# Patient Record
Sex: Female | Born: 2000 | Race: White | Hispanic: No | Marital: Single | Smoking: Never smoker
Health system: Southern US, Community
[De-identification: ages and names within clinical notes are randomized; demographics above are authoritative.]

## PROBLEM LIST (undated history)

## (undated) DIAGNOSIS — L509 Urticaria, unspecified: Secondary | ICD-10-CM

## (undated) HISTORY — DX: Urticaria, unspecified: L50.9

---
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
## 2021-05-18 ENCOUNTER — Ambulatory Visit (INDEPENDENT_AMBULATORY_CARE_PROVIDER_SITE_OTHER): Payer: No Typology Code available for payment source | Admitting: Family Medicine

## 2021-05-18 ENCOUNTER — Encounter: Payer: Self-pay | Admitting: Family Medicine

## 2021-05-18 DIAGNOSIS — J3089 Other allergic rhinitis: Secondary | ICD-10-CM | POA: Diagnosis not present

## 2021-05-18 DIAGNOSIS — J302 Other seasonal allergic rhinitis: Secondary | ICD-10-CM

## 2021-05-18 MED ORDER — AMOXICILLIN-POT CLAVULANATE 875-125 MG PO TABS: 1.0000 | ORAL_TABLET | Freq: Two times a day (BID) | ORAL | 0 refills | Status: DC

## 2021-05-18 MED ORDER — AZELASTINE HCL 0.1 % NA SOLN: 2.0000 | Freq: Two times a day (BID) | NASAL | 12 refills | Status: DC

## 2021-05-18 MED ORDER — AZELASTINE HCL 0.05 % OP SOLN: 1.0000 [drp] | Freq: Two times a day (BID) | OPHTHALMIC | 12 refills | Status: DC

## 2021-05-18 NOTE — Assessment & Plan Note (Signed)
Patient's symptoms likely related to underlying allergies.  Treating with azelastine nasal spray also treating with azelastine eyedrops.  If fails to improve or worsens, can start the Augmentin that was prescribed to cover for sinusitis (Wait and see) ?

## 2021-05-18 NOTE — Progress Notes (Signed)
? ?Subjective:  ?Patient ID: Crystal Hester, female    DOB: 27-Jun-2000  Age: 21 y.o. MRN: [id number] ? ?CC: ?Chief Complaint  ?Patient presents with  ? Allergies  ?  Stuffiness, allergy sx, itchy eyes. Began on Sunday. Using allergy med and decongestant   ? ? ?HPI: ? ?21 year old female presents for evaluation the above. ? ?Patient reports that her symptoms started on Saturday to Sunday.  She reports nasal congestion, itchy/watery eyes.  Has also had some rhinorrhea.  She is most bothered by the congestion.  Patient states that this started after she was outside in the garden.  She believes that this is secondary to allergies or possibly that she is developing a secondary sinus infection.  She is taking Zyrtec.  She is not taking Singulair.  No fever.  No other reported symptoms.  No other complaints or concerns at this time. ? ?Patient Active Problem List  ? Diagnosis Date Noted  ? Seasonal and perennial allergic rhinitis 11/11/2018  ? Chronic urticaria 11/11/2018  ? ? ?Social Hx   ?Social History  ? ?Socioeconomic History  ? Marital status: Single  ?  Spouse name: Not on file  ? Number of children: Not on file  ? Years of education: Not on file  ? Highest education level: Not on file  ?Occupational History  ? Not on file  ?Tobacco Use  ? Smoking status: Never  ? Smokeless tobacco: Never  ?Vaping Use  ? Vaping Use: Never used  ?Substance and Sexual Activity  ? Alcohol use: Never  ?  Alcohol/week: 0.0 standard drinks  ? Drug use: Never  ? Sexual activity: Never  ?  Birth control/protection: None  ?Other Topics Concern  ? Not on file  ?Social History Narrative  ? Not on file  ? ?Social Determinants of Health  ? ?Financial Resource Strain: Low Risk   ? Difficulty of Paying Living Expenses: Not hard at all  ?Food Insecurity: No Food Insecurity  ? Worried About Programme researcher, broadcasting/film/video in the Last Year: Never true  ? Ran Out of Food in the Last Year: Never true  ?Transportation Needs: No Transportation Needs  ? Lack of  Transportation (Medical): No  ? Lack of Transportation (Non-Medical): No  ?Physical Activity: Sufficiently Active  ? Days of Exercise per Week: 7 days  ? Minutes of Exercise per Session: 60 min  ?Stress: No Stress Concern Present  ? Feeling of Stress : Not at all  ?Social Connections: Moderately Isolated  ? Frequency of Communication with Friends and Family: More than three times a week  ? Frequency of Social Gatherings with Friends and Family: Twice a week  ? Attends Religious Services: More than 4 times per year  ? Active Member of Clubs or Organizations: No  ? Attends Banker Meetings: Never  ? Marital Status: Never married  ? ? ?Review of Systems ?Per HPI ? ?Objective:  ?BP 111/67   Pulse 66   Temp 97.7 ?F (36.5 ?C)   Wt 161 lb 3.2 oz (73.1 kg)   SpO2 96%   BMI 27.67 kg/m?  ? ? ?  05/18/2021  ? 11:25 AM 09/30/2020  ?  3:11 PM 06/06/2020  ?  8:42 AM  ?BP/Weight  ?Systolic BP 111 122 113  ?Diastolic BP 67 80 72  ?Wt. (Lbs) 161.2 169.5   ?BMI 27.67 kg/m2 29.09 kg/m2   ? ? ?Physical Exam ?Vitals and nursing note reviewed.  ?Constitutional:   ?   General: She is  not in acute distress. ?   Appearance: Normal appearance. She is not ill-appearing.  ?HENT:  ?   Head: Normocephalic and atraumatic.  ?   Right Ear: Tympanic membrane normal.  ?   Left Ear: Tympanic membrane normal.  ?   Nose: Congestion present.  ?Eyes:  ?   General:     ?   Right eye: No discharge.     ?   Left eye: No discharge.  ?   Conjunctiva/sclera: Conjunctivae normal.  ?Cardiovascular:  ?   Rate and Rhythm: Normal rate and regular rhythm.  ?Pulmonary:  ?   Effort: Pulmonary effort is normal.  ?   Breath sounds: Normal breath sounds. No wheezing, rhonchi or rales.  ?Neurological:  ?   Mental Status: She is alert.  ?Psychiatric:     ?   Mood and Affect: Mood normal.     ?   Behavior: Behavior normal.  ? ? ?Lab Results  ?Component Value Date  ? WBC 7.3 11/08/2018  ? HGB 13.8 11/08/2018  ? HCT 41.2 11/08/2018  ? PLT 234 11/08/2018  ?  GLUCOSE 76 11/08/2018  ? ALT 12 11/08/2018  ? AST 17 11/08/2018  ? NA 140 11/08/2018  ? K 4.2 11/08/2018  ? CL 101 11/08/2018  ? CREATININE 0.70 11/08/2018  ? BUN 12 11/08/2018  ? CO2 23 11/08/2018  ? ? ? ?Assessment & Plan:  ? ?Problem List Items Addressed This Visit   ? ?  ? Respiratory  ? Seasonal and perennial allergic rhinitis  ?  Patient's symptoms likely related to underlying allergies.  Treating with azelastine nasal spray also treating with azelastine eyedrops.  If fails to improve or worsens, can start the Augmentin that was prescribed to cover for sinusitis (Wait and see) ? ?  ?  ? ? ?Meds ordered this encounter  ?Medications  ? azelastine (ASTELIN) 0.1 % nasal spray  ?  Sig: Place 2 sprays into both nostrils 2 (two) times daily. Use in each nostril as directed  ?  Dispense:  30 mL  ?  Refill:  12  ? azelastine (OPTIVAR) 0.05 % ophthalmic solution  ?  Sig: Place 1 drop into both eyes 2 (two) times daily.  ?  Dispense:  6 mL  ?  Refill:  12  ? amoxicillin-clavulanate (AUGMENTIN) 875-125 MG tablet  ?  Sig: Take 1 tablet by mouth 2 (two) times daily.  ?  Dispense:  14 tablet  ?  Refill:  0  ? ?Everlene Other DO ?[facility] ? ?

## 2021-05-18 NOTE — Patient Instructions (Signed)
Continue Zyrtec daily. ? ?Medications as prescribed. ? ? ?If you fail to improve or worsen, you can start the antibiotic. ?

## 2021-08-16 ENCOUNTER — Encounter: Payer: Self-pay | Admitting: Adult Health

## 2021-08-16 ENCOUNTER — Ambulatory Visit (INDEPENDENT_AMBULATORY_CARE_PROVIDER_SITE_OTHER): Payer: No Typology Code available for payment source | Admitting: Adult Health

## 2021-08-16 VITALS — BP 114/72 | HR 90 | Ht 64.0 in | Wt 162.0 lb

## 2021-08-16 DIAGNOSIS — Z30011 Encounter for initial prescription of contraceptive pills: Secondary | ICD-10-CM | POA: Diagnosis not present

## 2021-08-16 MED ORDER — LO LOESTRIN FE 1 MG-10 MCG / 10 MCG PO TABS: 1.0000 | ORAL_TABLET | Freq: Every day | ORAL | 3 refills | Status: DC

## 2021-08-16 NOTE — Progress Notes (Signed)
  Subjective:     Patient ID: Halford Decamp, female   DOB: Feb 02, 2000, 21 y.o.   MRN: [id number]  HPI Arrietty is a 21 year old white female,single, G0P0, in to get on rx for OCs, she did take some last year but on regularly. Has never had sex, boyfriend is in [employer], and she she is at [facility]. PCP is Lilyan Punt MD.   Review of Systems Periods regular  Has never had sex    Reviewed past medical,surgical, social and family history. Reviewed medications and allergies.  Objective:   Physical Exam BP 114/72 (BP Location: Left Arm, Patient Position: Sitting, Cuff Size: Normal)   Pulse 90   Ht 5\' 4"  (1.626 m)   Wt 162 lb (73.5 kg)   LMP 08/01/2021 (Approximate)   BMI 27.81 kg/m     Skin warm and dry.  Lungs: clear to ausculation bilaterally. Cardiovascular: regular rate and rhythm.   Upstream - 08/16/21 0950       Pregnancy Intention Screening   Does the patient want to become pregnant in the next year? No    Does the patient's partner want to become pregnant in the next year? No    Would the patient like to discuss contraceptive options today? No      Contraception Wrap Up   Current Method Abstinence    End Method Oral Contraceptive;Abstinence    Contraception Counseling Provided Yes             Assessment:     1. Encounter for initial prescription of contraceptive pills Gave 2 packs to start with next period Use condoms if has sex Discount card given Meds ordered this encounter  Medications   Norethindrone-Ethinyl Estradiol-Fe Biphas (LO LOESTRIN FE) 1 MG-10 MCG / 10 MCG tablet    Sig: Take 1 tablet by mouth daily. Take 1 daily by mouth    Dispense:  84 tablet    Refill:  3    BIN 08/18/21, PCN CN, GRP [id number]    Order Specific Question:   Supervising Provider    Answer:   [id number] [[id number]]       Plan:     Return 03/15/22 for first pap and physical  No GC/CHL done today has never had sex

## 2022-04-10 ENCOUNTER — Telehealth: Payer: Self-pay

## 2022-04-10 ENCOUNTER — Telehealth: Payer: Self-pay | Admitting: Family Medicine

## 2022-04-10 NOTE — Telephone Encounter (Signed)
Pt is calling wanting a copy of immunization call when ready to pick up  Crystal Hester -[phone]

## 2022-04-10 NOTE — Telephone Encounter (Signed)
Please go ahead and order quantoferren TB test

## 2022-04-10 NOTE — Telephone Encounter (Signed)
Lab orders for TB or quanta fern test

## 2022-04-11 ENCOUNTER — Other Ambulatory Visit: Payer: Self-pay

## 2022-04-11 DIAGNOSIS — Z111 Encounter for screening for respiratory tuberculosis: Secondary | ICD-10-CM

## 2022-04-11 NOTE — Telephone Encounter (Signed)
Patient made aware per lab orders. 

## 2022-04-12 NOTE — Telephone Encounter (Signed)
Patient informed immunization record its ready for pick up.

## 2022-04-21 DIAGNOSIS — Z111 Encounter for screening for respiratory tuberculosis: Secondary | ICD-10-CM | POA: Diagnosis not present

## 2022-04-25 LAB — QUANTIFERON-TB GOLD PLUS
QuantiFERON Mitogen Value: >10 IU/mL
QuantiFERON Nil Value: 0.04 IU/mL
QuantiFERON TB1 Ag Value: 0.02 IU/mL
QuantiFERON TB2 Ag Value: 0.04 IU/mL
QuantiFERON-TB Gold Plus: NEGATIVE

## 2022-06-21 ENCOUNTER — Encounter: Payer: Self-pay | Admitting: Adult Health

## 2022-06-21 ENCOUNTER — Other Ambulatory Visit (HOSPITAL_COMMUNITY)
Admission: RE | Admit: 2022-06-21 | Discharge: 2022-06-21 | Disposition: A | Discharge status: Home / Self Care | Payer: 59 | Source: Ambulatory Visit | Attending: Adult Health | Admitting: Adult Health

## 2022-06-21 ENCOUNTER — Ambulatory Visit: Payer: 59 | Admitting: Adult Health

## 2022-06-21 VITALS — BP 121/77 | HR 79 | Ht 64.0 in | Wt 171.5 lb

## 2022-06-21 DIAGNOSIS — N898 Other specified noninflammatory disorders of vagina: Secondary | ICD-10-CM | POA: Insufficient documentation

## 2022-06-21 DIAGNOSIS — Z3041 Encounter for surveillance of contraceptive pills: Secondary | ICD-10-CM

## 2022-06-21 DIAGNOSIS — Z124 Encounter for screening for malignant neoplasm of cervix: Secondary | ICD-10-CM | POA: Insufficient documentation

## 2022-06-21 DIAGNOSIS — N930 Postcoital and contact bleeding: Secondary | ICD-10-CM | POA: Diagnosis not present

## 2022-06-21 DIAGNOSIS — Z3202 Encounter for pregnancy test, result negative: Secondary | ICD-10-CM | POA: Diagnosis not present

## 2022-06-21 LAB — POCT URINE PREGNANCY: Preg Test, Ur: NEGATIVE

## 2022-06-21 MED ORDER — LO LOESTRIN FE 1 MG-10 MCG / 10 MCG PO TABS: 1.0000 | ORAL_TABLET | Freq: Every day | ORAL | 3 refills | Status: DC

## 2022-06-21 NOTE — Progress Notes (Signed)
Patient ID: Crystal Hester, female   DOB: May 20, 2000, 22 y.o.   MRN: [id number] History of Present Illness: Crystal Hester is a 22 year old white female, single, G0P0, in complaining of bleeding after sex at times and she needs a pap.  PCP is Dr Lilyan Punt   Current Medications, Allergies, Past Medical History, Past Surgical History, Family History and Social History were reviewed in [employer] electronic medical record.     Review of Systems: + bleeding after sex at times(BF is in [place], so not having sex often) Has skin in vaginal area Has acne on chin and forehead    Physical Exam:BP 121/77 (BP Location: Left Arm, Patient Position: Sitting, Cuff Size: Normal)   Pulse 79   Ht 5\' 4"  (1.626 m)   Wt 171 lb 8 oz (77.8 kg)   LMP 06/04/2022   BMI 29.44 kg/m  UPT is negative  General:  Well developed, well nourished, no acute distress Skin:  Warm and dry,mild acne on chin Pelvic:  External genitalia is normal in appearance, no lesions.  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 Urethra has no lesions or masses. The cervix is nulliparous, pap with GC/CHL performed.  Uterus is felt to be normal size, shape, and contour.  No adnexal masses or tenderness noted.Bladder is non tender, no masses felt. Extremities/musculoskeletal:  No swelling or varicosities noted, no clubbing or cyanosis Psych:  No mood changes, alert and cooperative,seems happy Fall risk is low  Upstream - 06/21/22 0943       Pregnancy Intention Screening   Does the patient want to become pregnant in the next year? No    Does the patient's partner want to become pregnant in the next year? No    Would the patient like to discuss contraceptive options today? No      Contraception Wrap Up   Current Method Oral Contraceptive    End Method Oral Contraceptive            Examination chaperoned by Malachy Mood LPN   Impression and Plan: 1. Routine  Papanicolaou smear Pap sent Pap in 3 years if normal Physical in 1 year - Cytology - PAP( [facility])  2. Pregnancy examination or test, negative result - POCT urine pregnancy  3. Postcoital bleeding Use good lubricate Try vaginal dilator since not having sex often   4. Encounter for surveillance of contraceptive pills She likes lo Loestrin, will refill   Meds ordered this encounter  Medications   Norethindrone-Ethinyl Estradiol-Fe Biphas (LO LOESTRIN FE) 1 MG-10 MCG / 10 MCG tablet    Sig: Take 1 tablet by mouth daily. Take 1 daily by mouth    Dispense:  84 tablet    Refill:  3    BIN [id number], PCN CN, GRP [id number]    Order Specific Question:   Supervising Provider    Answer:   Duane Lope H [[id number]]     5. Hymenal remnant Can leave alone or if decides she wants trimmed, call for appt with MD

## 2022-06-22 LAB — CYTOLOGY - PAP
Adequacy: ABSENT
Chlamydia: NEGATIVE
Comment: NEGATIVE
Comment: NORMAL
Diagnosis: NEGATIVE
Neisseria Gonorrhea: NEGATIVE

## 2022-07-17 ENCOUNTER — Telehealth: Payer: Self-pay

## 2022-07-17 NOTE — Telephone Encounter (Signed)
Scopolamine patches, 1 box, apply 2 to 3 hours before going on the trip, may change the patch every 3 days, if the patches cause severe vomiting severe dizziness then discontinue the patch  May send in 1 box

## 2022-07-17 NOTE — Telephone Encounter (Signed)
Pt is going on a cruise and needs nausea patches called into [facility] on [address]

## 2022-07-18 ENCOUNTER — Other Ambulatory Visit: Payer: Self-pay

## 2022-07-18 DIAGNOSIS — R110 Nausea: Secondary | ICD-10-CM

## 2022-07-18 MED ORDER — SCOPOLAMINE 1 MG/3DAYS TD PT72: MEDICATED_PATCH | TRANSDERMAL | 0 refills | Status: DC

## 2022-11-20 ENCOUNTER — Ambulatory Visit: Payer: Self-pay | Admitting: Family Medicine

## 2022-11-20 NOTE — Telephone Encounter (Signed)
Copied from CRM [id number]. Topic: Clinical - Red Word Triage >> Nov 20, 2022  9:52 AM Deaijah H wrote: Red Word that prompted transfer to Nurse Triage: Upper left chest pain    Chief Complaint: Left side chest pain Symptoms: intermittent left side chest pain Frequency: Pain is intermittent Pertinent Negatives: Patient denies chest wall injury, shortness of breath, or radiating pain Disposition: [] ED /[] Urgent Care (no appt availability in office) / [] Appointment(In office/virtual)/ []  [facility]/ [] Home Care/ [] Refused Recommended Disposition /[] [place] [facility]/ [x]  Follow-up with PCP Additional Notes:   Reason for Disposition  [1] Chest pain(s) lasting a few seconds AND [2] persists > 3 days  Answer Assessment - Initial Assessment Questions 1. LOCATION: "Where does it hurt?"       Mainly on the left upper side  2. RADIATION: "Does the pain go anywhere else?" (e.g., into neck, jaw, arms, back)     Pain does that radiate anywhere  3. ONSET: "When did the chest pain begin?" (Minutes, hours or days)      Several months, before the summer  4. PATTERN: "Does the pain come and go, or has it been constant since it started?"  "Does it get worse with exertion?"      Pains comes and goes during the day, about 2x a days. Pain does get worse with certain movements  5. DURATION: "How long does it last" (e.g., seconds, minutes, hours)     Pain last less than a minute  6. SEVERITY: "How bad is the pain?"  (e.g., Scale 1-10; mild, moderate, or severe)    - MILD (1-3): doesn't interfere with normal activities     - MODERATE (4-7): interferes with normal activities or awakens from sleep    - SEVERE (8-10): excruciating pain, unable to do any normal activities       2, its enough to notice the pain, but not stop activity  7. CARDIAC RISK FACTORS: "Do you have any history of heart problems or risk factors for heart disease?" (e.g., angina, prior heart attack; diabetes, high blood  pressure, high cholesterol, smoker, or strong family history of heart disease)     Mom has history of AFD; recently diagnosed  8. PULMONARY RISK FACTORS: "Do you have any history of lung disease?"  (e.g., blood clots in lung, asthma, emphysema, birth control pills)     Take birth control  9. CAUSE: "What do you think is causing the chest pain?"     Could be related to weight lifting and working out  10. OTHER SYMPTOMS: "Do you have any other symptoms?" (e.g., dizziness, nausea, vomiting, sweating, fever, difficulty breathing, cough)       No  11. PREGNANCY: "Is there any chance you are pregnant?" "When was your last menstrual period?"       No; 2 weeks ago was last menstrual.  Protocols used: Chest Pain-A-AH

## 2022-11-20 NOTE — Telephone Encounter (Signed)
FYI:  Patient scheduled appointment: With Family Medicine 11/29/2022 at 9:20 AM

## 2022-11-29 ENCOUNTER — Ambulatory Visit: Payer: 59 | Admitting: Family Medicine

## 2022-11-29 VITALS — BP 110/71 | HR 88 | Temp 97.7°F | Ht 64.0 in | Wt 173.4 lb

## 2022-11-29 DIAGNOSIS — Z8279 Family history of other congenital malformations, deformations and chromosomal abnormalities: Secondary | ICD-10-CM | POA: Diagnosis not present

## 2022-11-29 DIAGNOSIS — R079 Chest pain, unspecified: Secondary | ICD-10-CM

## 2022-11-29 NOTE — Patient Instructions (Signed)
Labs ordered.  EKG looks good.  Echo ordered.  We will call with results.

## 2022-11-30 ENCOUNTER — Encounter: Payer: Self-pay | Admitting: Family Medicine

## 2022-11-30 LAB — CMP14+EGFR
ALT: 16 [IU]/L (ref 0–32)
AST: 18 [IU]/L (ref 0–40)
Albumin: 4.2 g/dL (ref 4.0–5.0)
Alkaline Phosphatase: 60 [IU]/L (ref 44–121)
BUN/Creatinine Ratio: 15 (ref 9–23)
BUN: 12 mg/dL (ref 6–20)
Bilirubin Total: 0.3 mg/dL (ref 0.0–1.2)
CO2: 21 mmol/L (ref 20–29)
Calcium: 9.3 mg/dL (ref 8.7–10.2)
Chloride: 106 mmol/L (ref 96–106)
Creatinine, Ser: 0.78 mg/dL (ref 0.57–1.00)
Globulin, Total: 2.3 g/dL (ref 1.5–4.5)
Glucose: 93 mg/dL (ref 70–99)
Potassium: 4.6 mmol/L (ref 3.5–5.2)
Sodium: 139 mmol/L (ref 134–144)
Total Protein: 6.5 g/dL (ref 6.0–8.5)
eGFR: 111 mL/min/{1.73_m2} (ref >59)

## 2022-11-30 LAB — CBC
Hematocrit: 42.2 % (ref 34.0–46.6)
Hemoglobin: 13.4 g/dL (ref 11.1–15.9)
MCH: 28.5 pg (ref 26.6–33.0)
MCHC: 31.8 g/dL (ref 31.5–35.7)
MCV: 90 fL (ref 79–97)
Platelets: 227 10*3/uL (ref 150–450)
RBC: 4.71 x10E6/uL (ref 3.77–5.28)
RDW: 12.2 % (ref 11.7–15.4)
WBC: 4.5 10*3/uL (ref 3.4–10.8)

## 2022-11-30 LAB — D-DIMER, QUANTITATIVE: D-DIMER: 0.8 mg{FEU}/L — ABNORMAL HIGH (ref 0.00–0.49)

## 2022-12-01 ENCOUNTER — Ambulatory Visit: Payer: Self-pay

## 2022-12-01 ENCOUNTER — Encounter (HOSPITAL_BASED_OUTPATIENT_CLINIC_OR_DEPARTMENT_OTHER): Payer: Self-pay | Admitting: Urology

## 2022-12-01 ENCOUNTER — Telehealth: Payer: Self-pay | Admitting: Family Medicine

## 2022-12-01 ENCOUNTER — Other Ambulatory Visit: Payer: Self-pay | Admitting: Family Medicine

## 2022-12-01 ENCOUNTER — Emergency Department (HOSPITAL_BASED_OUTPATIENT_CLINIC_OR_DEPARTMENT_OTHER)
Admission: EM | Admit: 2022-12-01 | Discharge: 2022-12-01 | Disposition: A | Discharge status: Home / Self Care | Payer: 59 | Attending: Emergency Medicine | Admitting: Emergency Medicine

## 2022-12-01 ENCOUNTER — Other Ambulatory Visit: Payer: Self-pay

## 2022-12-01 ENCOUNTER — Emergency Department (HOSPITAL_BASED_OUTPATIENT_CLINIC_OR_DEPARTMENT_OTHER): Payer: 59

## 2022-12-01 DIAGNOSIS — R7989 Other specified abnormal findings of blood chemistry: Secondary | ICD-10-CM | POA: Diagnosis not present

## 2022-12-01 DIAGNOSIS — R0789 Other chest pain: Secondary | ICD-10-CM | POA: Insufficient documentation

## 2022-12-01 DIAGNOSIS — R079 Chest pain, unspecified: Secondary | ICD-10-CM | POA: Diagnosis not present

## 2022-12-01 MED ORDER — IOHEXOL 350 MG/ML SOLN
75.0000 mL | Freq: Once | INTRAVENOUS | Status: AC | PRN
  Administered 2022-12-01: 75 mL via INTRAVENOUS

## 2022-12-01 NOTE — ED Triage Notes (Signed)
Pt states elevated d-dimer of 0.8 Left sided chest pain intermittent since June  Seen at PCP Friday and told to come today for CTA of Chest

## 2022-12-01 NOTE — Telephone Encounter (Signed)
On a three-way phone call spoke with patient as well as her mother Crystal Hester Date of birth verified with [name] She is on birth control pills She has elevated D-dimer Atypical sharp chest pain has been going on for over a month but because of elevated D-dimer there is no safe alternative on a Friday evening other than to go to the ER to get this evaluated along with CT scan of the chest rule out pulmonary embolism Patient is aware to go ahead with doing this We await the results of all of this additional workup If they should need [place] for anything else mom will reach out or patient will  Copy being forwarded to Dr. Adriana Simas as well

## 2022-12-01 NOTE — Discharge Instructions (Signed)
You were seen in the ER today with an elevated d-dimer test. Your CT angio was negative for signs of any clot in your lungs. There are no signs of pneumonia either or any other lung abnormality. This level was likely elevated for a number of other factors that are benign. I would recommend following up with your primary care provider for further evaluation. Your symptoms could likely be stress related given your current course load in school but I would seek further care if symptoms are worsening. Good luck with senior year and future PT school!

## 2022-12-01 NOTE — ED Provider Notes (Signed)
[facility] Provider Note   CSN: [id number] Arrival date & time: 12/01/22  1922     History Chief Complaint  Patient presents with   Elevated D-Dimer    Crystal Hester is a 22 y.o. female. Patient with no significant past history presents to the ED with concerns of elevated d-dimer. Patient has been experiencing intermittent left sided chest pain and tightness since about June 2024. Had labs checked by PCP which included d-dimer which was unfortunately elevated. Patient is currently on OCP but no other risk factors such as recent surgery, immobilization, prolonged travel, hemoptysis, or unilateral leg swelling. No history of DVT/PE. Patient's mother in room does however report that she herself, mother, had a PE in her 30s with only risk factor being OCP. No history of any familial coagulopathy as far as mother is aware. Patient was advised by PCP to present to the ED today for CTA given elevated d-dimer. Currently asymptomatic.  HPI     Home Medications Prior to Admission medications   Medication Sig Start Date End Date Taking? Authorizing Provider  cetirizine (ZYRTEC) 10 MG tablet Take 1 tablet 1-2 times daily as directed. Patient not taking: Reported on 11/29/2022 11/08/18   Alfonse Spruce, MD  EPINEPHrine (EPIPEN 2-PAK) 0.3 mg/0.3 mL IJ SOAJ injection Inject 0.3 mg into the muscle as needed for anaphylaxis. Please dispense mylan or teva brand Patient not taking: Reported on 11/29/2022 04/06/20   Alfonse Spruce, MD  Norethindrone-Ethinyl Estradiol-Fe Biphas (LO LOESTRIN FE) 1 MG-10 MCG / 10 MCG tablet Take 1 tablet by mouth daily. Take 1 daily by mouth 06/21/22   Adline Potter, NP  scopolamine (TRANSDERM-SCOP) 1 MG/3DAYS apply patch approximately 2 hours before the cruise then change the patch every 3 days, discontinue patch if severe dizziness vomiting headaches 07/18/22   Babs Sciara, MD      Allergies    Bactrim  [sulfamethoxazole-trimethoprim]    Review of Systems   Review of Systems  Respiratory:  Positive for chest tightness. Negative for shortness of breath.   All other systems reviewed and are negative.   Physical Exam Updated Vital Signs BP 122/84   Pulse (!) 59   Temp 97.7 F (36.5 C) (Oral)   Resp (!) 21   Ht 5\' 4"  (1.626 m)   Wt 78.6 kg   LMP 11/14/2022   SpO2 100%   BMI 29.74 kg/m  Physical Exam Vitals and nursing note reviewed.  Constitutional:      General: She is not in acute distress.    Appearance: She is well-developed.  HENT:     Head: Normocephalic and atraumatic.  Eyes:     Conjunctiva/sclera: Conjunctivae normal.  Cardiovascular:     Rate and Rhythm: Normal rate and regular rhythm.     Heart sounds: No murmur heard. Pulmonary:     Effort: Pulmonary effort is normal. No respiratory distress.     Breath sounds: Normal breath sounds.  Abdominal:     Palpations: Abdomen is soft.     Tenderness: There is no abdominal tenderness.  Musculoskeletal:        General: No swelling.     Cervical back: Neck supple.  Skin:    General: Skin is warm and dry.     Capillary Refill: Capillary refill takes less than 2 seconds.  Neurological:     Mental Status: She is alert.  Psychiatric:        Mood and Affect: Mood  normal.     ED Results / Procedures / Treatments   Labs (all labs ordered are listed, but only abnormal results are displayed) Labs Reviewed - No data to display  EKG None  Radiology CT Angio Chest PE W and/or Wo Contrast  Result Date: 12/01/2022 CLINICAL DATA:  Positive D-dimer.  Left-sided chest pain. EXAM: CT ANGIOGRAPHY CHEST WITH CONTRAST TECHNIQUE: Multidetector CT imaging of the chest was performed using the standard protocol during bolus administration of intravenous contrast. Multiplanar CT image reconstructions and MIPs were obtained to evaluate the vascular anatomy. RADIATION DOSE REDUCTION: This exam was performed according to the  departmental dose-optimization program which includes automated exposure control, adjustment of the mA and/or kV according to patient size and/or use of iterative reconstruction technique. CONTRAST:  75mL OMNIPAQUE IOHEXOL 350 MG/ML SOLN COMPARISON:  None Available. FINDINGS: Cardiovascular: Satisfactory opacification of the pulmonary arteries to the segmental level. No evidence of pulmonary embolism. Normal heart size. No pericardial effusion. Mediastinum/Nodes: No enlarged mediastinal, hilar, or axillary lymph nodes. Thyroid gland, trachea, and esophagus demonstrate no significant findings. 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 Lungs/Pleura: Lungs are clear. No pleural effusion or pneumothorax. Upper Abdomen: No acute abnormality. Musculoskeletal: No chest wall abnormality. No acute or significant osseous findings. Review of the MIP images confirms the above findings. IMPRESSION: No evidence for pulmonary embolism or other acute cardiopulmonary process. Electronically Signed   By: Darliss Cheney M.D.   On: 12/01/2022 20:49    Procedures Procedures   Medications Ordered in ED Medications  iohexol (OMNIPAQUE) 350 MG/ML injection 75 mL (75 mLs Intravenous Contrast Given 12/01/22 2021)    ED Course/ Medical Decision Making/ A&P                               Medical Decision Making Amount and/or Complexity of Data Reviewed Radiology: ordered.  Risk Prescription drug management.   This patient presents to the ED for concern of elevated d-dimer. Differential diagnosis includes PE, DVT, bleeding disorder, dehydration, recent immobilization   Lab Tests:  I Ordered, and personally interpreted labs.  The pertinent results include:  Labs from 11/29/2022 with PCP show unremarkable CBC, normal CMP14+EGFR, and elevated d-dimer at 0.8.    Imaging Studies ordered:  I ordered imaging studies including CT angio chest I independently visualized and interpreted imaging which showed no  evidence of PE or any other pulmonary abnormalities I agree with the radiologist interpretation   Problem List / ED Course:  Patient without significant past medical history presents to the emergency department with concerns of an elevated D-dimer.  Patient was evaluated by primary care provider in the last few days and had blood work done.  D-dimer was elevated and was ordered initially out of concerns of patient's prolonged course of intermittent chest tightness since about June of this year.  Patient is on an OCP but no other major risk factors.  Given patient's use of OCP, cannot be perked out and therefore D-dimer was ordered.  Given elevation in D-dimer and prolonged course of symptoms, CT angio will be performed today.  Patient denies any chance of pregnancy and even had a negative pregnancy test earlier today at home.  Currently asymptomatic at this time and no acute focal concerns.  No acute indication for repeat labs or any symptomatic treatment at this time. CT angio performed waiting on radiologist read. CT angio chest negative for signs of pulmonary embolism or any other  acute abnormality.  Suspect symptoms could possibly be stress related as patient is currently a senior in college and applying to graduate programs for her future career plans.  She does not overtly have any signs of panic disorder and is not appear to be any sort of mental distress although discussed with patient and mother about possible stress-induced symptoms. Informed patient of these findings and encourage patient to follow-up with primary care provider for further assessment.  Discussed strict return precautions.  Patient discharged home in stable condition.  Final Clinical Impression(s) / ED Diagnoses Final diagnoses:  Chest tightness    Rx / DC Orders ED Discharge Orders     None         Smitty Knudsen, PA-C 12/01/22 2109    Royanne Foots, DO 12/09/22 1041

## 2022-12-02 DIAGNOSIS — R079 Chest pain, unspecified: Secondary | ICD-10-CM | POA: Insufficient documentation

## 2022-12-02 NOTE — Progress Notes (Addendum)
Subjective:  Patient ID: Crystal Hester, female    DOB: 2000/10/15  Age: 22 y.o. MRN: [id number]  CC:  Chest pain   HPI:  22 year old female presents for evaluation of the above.  Patient has had intermittent sharp, left-sided chest pain since June.  Seems to be becoming more frequent.  Seems to be exacerbated by certain movements.  Patient thinks that anxiety may be playing a role as well.  No palpitations.  No shortness of breath.  She is on oral contraceptives.  Mother is concerned as she had an ASD that was discovered after she was having some chest discomfort and prior TIA.  This occurred when the mother was young.  She is able to exercise without difficulty.  No relieving factors.  Patient Active Problem List   Diagnosis Date Noted   Chest pain 12/02/2022   Encounter for surveillance of contraceptive pills 06/21/2022   Seasonal and perennial allergic rhinitis 11/11/2018   Chronic urticaria 11/11/2018    Social Hx   Social History   Socioeconomic History   Marital status: Single    Spouse name: Not on file   Number of children: Not on file   Years of education: Not on file   Highest education level: Bachelor's degree (e.g., BA, AB, BS)  Occupational History   Not on file  Tobacco Use   Smoking status: Never   Smokeless tobacco: Never  Vaping Use   Vaping status: Never Used  Substance and Sexual Activity   Alcohol use: Never    Alcohol/week: 0.0 standard drinks of alcohol   Drug use: Never   Sexual activity: Yes    Birth control/protection: Pill, Condom  Other Topics Concern   Not on file  Social History Narrative   Not on file   Social Drivers of Health   Financial Resource Strain: Low Risk  (11/28/2022)   Overall Financial Resource Strain (CARDIA)    Difficulty of Paying Living Expenses: Not hard at all  Food Insecurity: No Food Insecurity (11/28/2022)   Hunger Vital Sign    Worried About Running Out of Food in the Last Year: Never true    Ran Out of  Food in the Last Year: Never true  Transportation Needs: No Transportation Needs (11/28/2022)   PRAPARE - Administrator, Civil Service (Medical): No    Lack of Transportation (Non-Medical): No  Physical Activity: Sufficiently Active (11/28/2022)   Exercise Vital Sign    Days of Exercise per Week: 5 days    Minutes of Exercise per Session: 40 min  Stress: Stress Concern Present (11/28/2022)   [employer] of Occupational Health - Occupational Stress Questionnaire    Feeling of Stress : To some extent  Social Connections: Moderately Isolated (11/28/2022)   Social Connection and Isolation Panel [NHANES]    Frequency of Communication with Friends and Family: More than three times a week    Frequency of Social Gatherings with Friends and Family: More than three times a week    Attends Religious Services: More than 4 times per year    Active Member of [employer] or Organizations: No    Attends Engineer, structural: Not on file    Marital Status: Never married    Review of Systems Per HPI  Objective:  BP 110/71   Pulse 88   Temp 97.7 F (36.5 C)   Ht 5\' 4"  (1.626 m)   Wt 173 lb 6.4 oz (78.7 kg)   SpO2 99%   BMI  29.76 kg/m      12/01/2022    9:00 PM 12/01/2022    7:45 PM 12/01/2022    7:29 PM  BP/Weight  Systolic BP 122 143 156  Diastolic BP 84 90 99    Physical Exam Vitals and nursing note reviewed.  Constitutional:      General: She is not in acute distress.    Appearance: Normal appearance.  HENT:     Head: Normocephalic and atraumatic.  Eyes:     General:        Right eye: No discharge.        Left eye: No discharge.     Conjunctiva/sclera: Conjunctivae normal.  Cardiovascular:     Rate and Rhythm: Normal rate and regular rhythm.     Heart sounds: No murmur heard. Pulmonary:     Effort: Pulmonary effort is normal.     Breath sounds: Normal breath sounds. No wheezing, rhonchi or rales.  Neurological:     Mental Status: She is alert.   Psychiatric:        Mood and Affect: Mood normal.        Behavior: Behavior normal.     Lab Results  Component Value Date   WBC 4.5 11/29/2022   HGB 13.4 11/29/2022   HCT 42.2 11/29/2022   PLT 227 11/29/2022   GLUCOSE 93 11/29/2022   ALT 16 11/29/2022   AST 18 11/29/2022   NA 139 11/29/2022   K 4.6 11/29/2022   CL 106 11/29/2022   CREATININE 0.78 11/29/2022   BUN 12 11/29/2022   CO2 21 11/29/2022     Assessment & Plan:   Problem List Items Addressed This Visit       Other   Chest pain - Primary   Labs today including D-dimer.  Arranging echo. EKG was obtained today and was independently reviewed by me.  Interpretation: Normal sinus rhythm with a rate of 67.  Incomplete right bundle-branch block.  No ST or T wave abnormalities.  Overall, her exam is reassuring and she has no significant cardiovascular risk factors.      Relevant Orders   CBC (Completed)   CMP14+EGFR (Completed)   D-dimer, quantitative (Completed)   ECHOCARDIOGRAM COMPLETE BUBBLE STUDY   EKG 12-Lead (Completed)   Other Visit Diagnoses       Family history of ASD (atrial septal defect)       Relevant Orders   ECHOCARDIOGRAM COMPLETE BUBBLE STUDY       Follow-up:  Pending workup   Shyra Emile Adriana Simas DO [facility]

## 2022-12-02 NOTE — Assessment & Plan Note (Addendum)
Labs today including D-dimer.  Arranging echo. EKG was obtained today and was independently reviewed by me.  Interpretation: Normal sinus rhythm with a rate of 67.  Incomplete right bundle-branch block.  No ST or T wave abnormalities.  Overall, her exam is reassuring and she has no significant cardiovascular risk factors.

## 2022-12-04 NOTE — Telephone Encounter (Signed)
Crystal Sams, DO     Dr. Lorin Picket directed her the ED. She had CTA.

## 2022-12-14 ENCOUNTER — Encounter: Payer: Self-pay | Admitting: Family Medicine

## 2022-12-14 ENCOUNTER — Other Ambulatory Visit: Payer: Self-pay | Admitting: Family Medicine

## 2022-12-17 ENCOUNTER — Other Ambulatory Visit: Payer: Self-pay | Admitting: Family Medicine

## 2022-12-17 DIAGNOSIS — R079 Chest pain, unspecified: Secondary | ICD-10-CM

## 2022-12-18 NOTE — Telephone Encounter (Signed)
Tommie Sams, DO t    Order has been placed in an orders only encounter.

## 2022-12-18 NOTE — Telephone Encounter (Signed)
Crystal Sams, DO   Order has been placed in an orders only encounter.

## 2022-12-21 ENCOUNTER — Other Ambulatory Visit: Payer: Self-pay | Admitting: Family Medicine

## 2022-12-21 ENCOUNTER — Ambulatory Visit (HOSPITAL_COMMUNITY): Payer: 59 | Attending: Cardiology

## 2022-12-21 DIAGNOSIS — Q211 Atrial septal defect, unspecified: Secondary | ICD-10-CM

## 2022-12-21 DIAGNOSIS — R079 Chest pain, unspecified: Secondary | ICD-10-CM | POA: Diagnosis not present

## 2022-12-21 LAB — ECHOCARDIOGRAM COMPLETE BUBBLE STUDY
Area-P 1/2: 3.5 cm2
S' Lateral: 3.4 cm

## 2022-12-26 ENCOUNTER — Inpatient Hospital Stay: Payer: 59 | Admitting: Family Medicine

## 2023-01-12 ENCOUNTER — Ambulatory Visit: Payer: 59 | Admitting: Cardiovascular Disease

## 2023-01-25 ENCOUNTER — Encounter: Payer: Self-pay | Admitting: Cardiovascular Disease

## 2023-01-25 ENCOUNTER — Ambulatory Visit: Payer: 59 | Attending: Cardiovascular Disease | Admitting: Cardiovascular Disease

## 2023-01-25 VITALS — BP 104/66 | HR 77 | Ht 64.0 in | Wt 167.8 lb

## 2023-01-25 DIAGNOSIS — R079 Chest pain, unspecified: Secondary | ICD-10-CM

## 2023-01-25 NOTE — Patient Instructions (Signed)
Testing/Procedures: ECHO prior to 1 year follow up Your physician has requested that you have an echocardiogram. Echocardiography is a painless test that uses sound waves to create images of your heart. It provides your doctor with information about the size and shape of your heart and how well your heart's chambers and valves are working. This procedure takes approximately one hour. There are no restrictions for this procedure. Please do NOT wear cologne, perfume, aftershave, or lotions (deodorant is allowed). Please arrive 15 minutes prior to your appointment time.  Please note: We ask at that you not bring children with you during ultrasound (echo/ vascular) testing. Due to room size and safety concerns, children are not allowed in the ultrasound rooms during exams. Our front office staff cannot provide observation of children in our lobby area while testing is being conducted. An adult accompanying a patient to their appointment will only be allowed in the ultrasound room at the discretion of the ultrasound technician under special circumstances. We apologize for any inconvenience.   Follow-Up: At [facility], you and your health needs are our priority.  As part of our continuing mission to provide you with exceptional heart care, we have created designated Provider Care Teams.  These Care Teams include your primary Cardiologist (physician) and Advanced Practice Providers (APPs -  Physician Assistants and Nurse Practitioners) who all work together to provide you with the care you need, when you need it.  We recommend signing up for the patient portal called "MyChart".  Sign up information is provided on this After Visit Summary.  MyChart is used to connect with patients for Virtual Visits (Telemedicine).  Patients are able to view lab/test results, encounter notes, upcoming appointments, etc.  Non-urgent messages can be sent to your provider as well.   To learn more about what you can do  with MyChart, go to [email or web address].    Your next appointment:   1 year(s)  Provider:   Tonny Bollman, MD    Other Instructions   1st Floor: - Lobby - Registration  - Pharmacy  - Lab - Cafe  2nd Floor: - PV Lab - Diagnostic Testing (echo, CT, nuclear med)  3rd Floor: - Vacant  4th Floor: - TCTS (cardiothoracic surgery) - AFib Clinic - Structural Heart Clinic - Vascular Surgery  - Vascular Ultrasound  5th Floor: - [facility] (general and EP) - Clinical Pharmacy for coumadin, hypertension, lipid, weight-loss medications, and med management appointments    Valet parking services will be available as well.

## 2023-01-25 NOTE — Progress Notes (Signed)
Cardiology Office Note:    Date:  01/26/2023   ID:  ORIE CUTTINO, DOB 2000-05-05, MRN [id number]  PCP:  Babs Sciara, MD   [facility] Providers Cardiologist:  None     Referring MD: Babs Sciara, MD   Chief Complaint  Patient presents with   Chest Pain    History of Present Illness:    Crystal Hester is a 23 y.o. female here for evaluation of chest pain and possible PFO.  She is a healthy 23 year old who has been having some left-sided chest pain that is occurring in the left upper chest and axilla.  She is active and does regular isometric exercise and thinks that some of this may be exacerbated by her exercises.  There is tenderness to palpation over her left chest and this can impact her symptoms.  She has not had any central pressure with exertion.  No recent heart palpitations.  She had an echo bubble study that showed a few very late bubbles that could come from either intrapulmonary shunting or a small PFO.  She has no history of stroke or TIA.  Her mother has a history of TIA and underwent transcatheter ASD closure in the past.  The patient has no family history of premature CAD.  She has no other complaints today.   Current Medications: Current Meds  Medication Sig   aspirin EC 81 MG tablet Take 81 mg by mouth daily. Swallow whole.     Allergies:   Bactrim [sulfamethoxazole-trimethoprim]   ROS:   Please see the history of present illness.    All other systems reviewed and are negative.  EKGs/Labs/Other Studies Reviewed:    The following studies were reviewed today: Cardiac Studies & Procedures      ECHOCARDIOGRAM  ECHOCARDIOGRAM COMPLETE BUBBLE STUDY 12/21/2022  Narrative ECHOCARDIOGRAM REPORT    Patient Name:   Crystal Hester Date of Exam: 12/21/2022 Medical Rec #:  [id number]       Height:       64.0 in Accession #:    [id number]      Weight:       173.3 lb Date of Birth:  08-07-2000       BSA:          1.841 m Patient Age:    22  years        BP:           110/71 mmHg Patient Gender: F               HR:           65 bpm. Exam Location:  [address]  Procedure: 2D Echo, 3D Echo, Cardiac Doppler, Color Doppler, Strain Analysis and Saline Contrast Bubble Study  Indications:    R07.9 Chest pain Family History of ASD  History:        Patient has no prior history of Echocardiogram examinations.  Sonographer:    Sedonia Small Rodgers-Jones RDCS Referring Phys: 37 JAYCE G COOK  IMPRESSIONS   1. Left ventricular ejection fraction, by estimation, is 55 to 60%. The left ventricle has normal function. The left ventricle has no regional wall motion abnormalities. Left ventricular diastolic parameters are consistent with Grade I diastolic dysfunction (impaired relaxation). The average left ventricular global longitudinal strain is -25.8 %. The global longitudinal strain is normal. 2. Right ventricular systolic function is normal. The right ventricular size is normal. 3. Bubble study late positive at rest and with Valsalva suggestive of  intrapulmonary shunting. With cough bubble study appears to be early positive suggestive of possible interatrial shunting. Agitated saline contrast bubble study was positive with shunting observed after >6 cardiac cycles suggestive of intrapulmonary shunting. 4. The mitral valve is normal in structure. Trivial mitral valve regurgitation. No evidence of mitral stenosis. 5. The aortic valve is normal in structure. Aortic valve regurgitation is not visualized. No aortic stenosis is present. 6. The inferior vena cava is normal in size with greater than 50% respiratory variability, suggesting right atrial pressure of 3 mmHg.  FINDINGS Left Ventricle: Left ventricular ejection fraction, by estimation, is 55 to 60%. The left ventricle has normal function. The left ventricle has no regional wall motion abnormalities. The average left ventricular global longitudinal strain is -25.8 %. The global  longitudinal strain is normal. The left ventricular internal cavity size was normal in size. There is no left ventricular hypertrophy. Left ventricular diastolic parameters are consistent with Grade I diastolic dysfunction (impaired relaxation).  Right Ventricle: The right ventricular size is normal. No increase in right ventricular wall thickness. Right ventricular systolic function is normal.  Left Atrium: Left atrial size was normal in size.  Right Atrium: Right atrial size was normal in size.  Pericardium: There is no evidence of pericardial effusion.  Mitral Valve: The mitral valve is normal in structure. Trivial mitral valve regurgitation. No evidence of mitral valve stenosis.  Tricuspid Valve: The tricuspid valve is normal in structure. Tricuspid valve regurgitation is trivial. No evidence of tricuspid stenosis.  Aortic Valve: The aortic valve is normal in structure. Aortic valve regurgitation is not visualized. No aortic stenosis is present.  Pulmonic Valve: The pulmonic valve was normal in structure. Pulmonic valve regurgitation is trivial. No evidence of pulmonic stenosis.  Aorta: The aortic root is normal in size and structure.  Venous: The inferior vena cava is normal in size with greater than 50% respiratory variability, suggesting right atrial pressure of 3 mmHg.  IAS/Shunts: No atrial level shunt detected by color flow Doppler. Agitated saline contrast was given intravenously to evaluate for intracardiac shunting. Agitated saline contrast bubble study was positive with shunting observed after >6 cardiac cycles suggestive of intrapulmonary shunting.   LEFT VENTRICLE PLAX 2D LVIDd:         5.10 cm   Diastology LVIDs:         3.40 cm   LV e' medial:    10.80 cm/s LV PW:         0.50 cm   LV E/e' medial:  9.4 LV IVS:        0.50 cm   LV e' lateral:   18.00 cm/s LVOT diam:     2.00 cm   LV E/e' lateral: 5.6 LV SV:         62 LV SV Index:   34        2D Longitudinal  Strain LVOT Area:     3.14 cm  2D Strain GLS (A2C):   -25.8 % 2D Strain GLS (A3C):   -25.3 % 2D Strain GLS (A4C):   -26.3 % 2D Strain GLS Avg:     -25.8 %  3D Volume EF: 3D EF:        60 % LV EDV:       147 ml LV ESV:       59 ml LV SV:        88 ml  RIGHT VENTRICLE             IVC  RV Basal diam:  3.60 cm     IVC diam: 1.10 cm RV S prime:     14.80 cm/s TAPSE (M-mode): 2.7 cm  LEFT ATRIUM             Index        RIGHT ATRIUM           Index LA diam:        3.80 cm 2.06 cm/m   RA Area:     11.10 cm LA Vol (A2C):   34.0 ml 18.47 ml/m  RA Volume:   26.90 ml  14.61 ml/m LA Vol (A4C):   31.7 ml 17.22 ml/m LA Biplane Vol: 33.9 ml 18.42 ml/m AORTIC VALVE LVOT Vmax:   92.00 cm/s LVOT Vmean:  59.600 cm/s LVOT VTI:    0.196 m  AORTA Ao Root diam: 2.90 cm Ao Asc diam:  3.10 cm  MITRAL VALVE                TRICUSPID VALVE MV Area (PHT): 3.50 cm     TR Peak grad:   11.6 mmHg MV Decel Time: 217 msec     TR Vmax:        170.00 cm/s MV E velocity: 101.15 cm/s MV A velocity: 64.20 cm/s   SHUNTS MV E/A ratio:  1.58         Systemic VTI:  0.20 m Systemic Diam: 2.00 cm  Arvilla Meres MD Electronically signed by Arvilla Meres MD Signature Date/Time: 12/21/2022/11:04:53 AM    Final             EKG:        Recent Labs: 11/29/2022: ALT 16; BUN 12; Creatinine, Ser 0.78; Hemoglobin 13.4; Platelets 227; Potassium 4.6; Sodium 139  Recent Lipid Panel No results found for: "CHOL", "TRIG", "HDL", "CHOLHDL", "VLDL", "LDLCALC", "LDLDIRECT"   Risk Assessment/Calculations:                Physical Exam:    VS:  BP 104/66   Pulse 77   Ht 5\' 4"  (1.626 m)   Wt 167 lb 12.8 oz (76.1 kg)   SpO2 99%   BMI 28.80 kg/m     Wt Readings from Last 3 Encounters:  01/25/23 167 lb 12.8 oz (76.1 kg)  12/01/22 173 lb 4.5 oz (78.6 kg)  11/29/22 173 lb 6.4 oz (78.7 kg)     GEN:  Well nourished, well developed in no acute distress HEENT: Normal NECK: No JVD; No carotid  bruits LYMPHATICS: No lymphadenopathy CARDIAC: RRR, no murmurs, rubs, gallops RESPIRATORY:  Clear to auscultation without rales, wheezing or rhonchi  ABDOMEN: Soft, non-tender, non-distended MUSCULOSKELETAL:  No edema; No deformity  SKIN: Warm and dry NEUROLOGIC:  Alert and oriented x 3 PSYCHIATRIC:  Normal affect   Assessment & Plan Chest pain, unspecified type The patient has atypical chest pain that is almost certainly musculoskeletal in nature.  She is reassured in this regard.  I studied her echo images and she had a very good bubble study that showed no right-to-left shunting across the interatrial septum at rest or with Valsalva.  The third bubble study that was performed with cough showed a few late bubbles in the left heart.  I am not sure if this is from a small pulmonary AVM or a small PFO, but I do not think there is any indication for further testing at this time.  There is no indication for the patient to be on aspirin as she has had  no clinical events.  She will follow-up as needed.      Medication Adjustments/Labs and Tests Ordered: Current medicines are reviewed at length with the patient today.  Concerns regarding medicines are outlined above.  Orders Placed This Encounter  Procedures   ECHOCARDIOGRAM COMPLETE   No orders of the defined types were placed in this encounter.   Patient Instructions  Testing/Procedures: ECHO prior to 1 year follow up Your physician has requested that you have an echocardiogram. Echocardiography is a painless test that uses sound waves to create images of your heart. It provides your doctor with information about the size and shape of your heart and how well your heart's chambers and valves are working. This procedure takes approximately one hour. There are no restrictions for this procedure. Please do NOT wear cologne, perfume, aftershave, or lotions (deodorant is allowed). Please arrive 15 minutes prior to your appointment time.  Please  note: We ask at that you not bring children with you during ultrasound (echo/ vascular) testing. Due to room size and safety concerns, children are not allowed in the ultrasound rooms during exams. Our front office staff cannot provide observation of children in our lobby area while testing is being conducted. An adult accompanying a patient to their appointment will only be allowed in the ultrasound room at the discretion of the ultrasound technician under special circumstances. We apologize for any inconvenience.   Follow-Up: At [facility], you and your health needs are our priority.  As part of our continuing mission to provide you with exceptional heart care, we have created designated Provider Care Teams.  These Care Teams include your primary Cardiologist (physician) and Advanced Practice Providers (APPs -  Physician Assistants and Nurse Practitioners) who all work together to provide you with the care you need, when you need it.  We recommend signing up for the patient portal called "MyChart".  Sign up information is provided on this After Visit Summary.  MyChart is used to connect with patients for Virtual Visits (Telemedicine).  Patients are able to view lab/test results, encounter notes, upcoming appointments, etc.  Non-urgent messages can be sent to your provider as well.   To learn more about what you can do with MyChart, go to [email or web address].    Your next appointment:   1 year(s)  Provider:   Tonny Bollman, MD    Other Instructions   1st Floor: - Lobby - Registration  - Pharmacy  - Lab - Cafe  2nd Floor: - PV Lab - Diagnostic Testing (echo, CT, nuclear med)  3rd Floor: - Vacant  4th Floor: - TCTS (cardiothoracic surgery) - AFib Clinic - Structural Heart Clinic - Vascular Surgery  - Vascular Ultrasound  5th Floor: - HeartCare Cardiology (general and EP) - Clinical Pharmacy for coumadin, hypertension, lipid, weight-loss medications, and med  management appointments    Valet parking services will be available as well.          Signed, Tonny Bollman, MD  01/26/2023 5:34 PM    [facility]

## 2023-01-26 NOTE — Assessment & Plan Note (Signed)
The patient has atypical chest pain that is almost certainly musculoskeletal in nature.  She is reassured in this regard.  I studied her echo images and she had a very good bubble study that showed no right-to-left shunting across the interatrial septum at rest or with Valsalva.  The third bubble study that was performed with cough showed a few late bubbles in the left heart.  I am not sure if this is from a small pulmonary AVM or a small PFO, but I do not think there is any indication for further testing at this time.  There is no indication for the patient to be on aspirin as she has had no clinical events.  She will follow-up as needed.

## 2023-02-02 ENCOUNTER — Ambulatory Visit: Payer: 59 | Admitting: Cardiovascular Disease

## 2023-02-27 ENCOUNTER — Ambulatory Visit: Payer: 59 | Admitting: Cardiology

## 2023-03-03 ENCOUNTER — Encounter: Payer: Self-pay | Admitting: Family Medicine

## 2023-03-06 ENCOUNTER — Other Ambulatory Visit: Payer: Self-pay | Admitting: Nurse Practitioner

## 2023-03-06 MED ORDER — SCOPOLAMINE 1 MG/3DAYS TD PT72: 1.0000 | MEDICATED_PATCH | TRANSDERMAL | 0 refills | Status: AC

## 2023-06-27 ENCOUNTER — Other Ambulatory Visit: Payer: Self-pay | Admitting: Adult Health

## 2023-06-27 MED ORDER — LO LOESTRIN FE 1 MG-10 MCG / 10 MCG PO TABS
1.0000 | ORAL_TABLET | Freq: Every day | ORAL | 3 refills | Status: AC
  Filled 2024-02-08: qty 84, 84d supply, fill #0

## 2023-06-27 NOTE — Progress Notes (Signed)
Refilled lo Loestrin  

## 2023-10-30 ENCOUNTER — Telehealth: Payer: Self-pay

## 2023-10-30 NOTE — Telephone Encounter (Signed)
 Copied from CRM [id number]. Topic: Clinical - Medical Advice >> Oct 29, 2023  1:42 PM Shanda MATSU wrote: Reason for CRM: Patient is calling in to confirm if she has had certain vaccines, patient stated that she is needing a 3 series Tetanus vaccine with one of them being the Tdap vaccine, is req a call back to confirm if she had these vaccines. >> Oct 29, 2023  2:07 PM Rosaria BRAVO wrote: Pt needs tdap vaccine next available Best contact: [phone]

## 2023-10-31 NOTE — Telephone Encounter (Signed)
>>   Oct 31, 2023 11:59 AM Lonell PEDLAR wrote: Patient called again, stating she needs tdap asap, please call back to advise.  LVM for patient to return call. [facility].  MyChart message also sent.    Please contact patient to schedule. Thank you!

## 2023-11-26 ENCOUNTER — Telehealth: Payer: Self-pay

## 2023-11-26 DIAGNOSIS — Z111 Encounter for screening for respiratory tuberculosis: Secondary | ICD-10-CM

## 2023-11-26 NOTE — Telephone Encounter (Signed)
 Copied from CRM [id number]. Topic: Clinical - Request for Lab/Test Order >> Nov 26, 2023  9:43 AM Kendralyn S wrote: Reason for CRM: requesting TB blood test for school

## 2023-12-03 ENCOUNTER — Telehealth (HOSPITAL_COMMUNITY): Payer: Self-pay | Admitting: Cardiovascular Disease

## 2023-12-03 NOTE — Telephone Encounter (Signed)
 I called to schedule her yearly echo in January 2026. Patient states she will call us  back to schedule due to she is starting school. Order will be removed from the active echo WQ and when pt calls back we will reinstate the order. Thank you.

## 2023-12-25 ENCOUNTER — Ambulatory Visit (INDEPENDENT_AMBULATORY_CARE_PROVIDER_SITE_OTHER): Payer: Self-pay | Admitting: Physician Assistant

## 2023-12-25 ENCOUNTER — Encounter: Payer: Self-pay | Admitting: Physician Assistant

## 2023-12-25 ENCOUNTER — Other Ambulatory Visit: Payer: Self-pay | Admitting: *Deleted

## 2023-12-25 VITALS — BP 119/79 | HR 71 | Temp 98.4°F | Ht 64.0 in | Wt 167.8 lb

## 2023-12-25 DIAGNOSIS — Z23 Encounter for immunization: Secondary | ICD-10-CM | POA: Diagnosis not present

## 2023-12-25 DIAGNOSIS — Z0000 Encounter for general adult medical examination without abnormal findings: Secondary | ICD-10-CM

## 2023-12-25 DIAGNOSIS — Z111 Encounter for screening for respiratory tuberculosis: Secondary | ICD-10-CM

## 2023-12-25 DIAGNOSIS — F411 Generalized anxiety disorder: Secondary | ICD-10-CM | POA: Diagnosis not present

## 2023-12-25 MED ORDER — SERTRALINE HCL 50 MG PO TABS: 50.0000 mg | ORAL_TABLET | Freq: Every day | ORAL | 1 refills | Status: AC

## 2023-12-25 NOTE — Assessment & Plan Note (Signed)
 Patient presents today with increased anxiety symptoms. 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 She appears well at this time. Discussion of treatment options.  - Start Zoloft  50 mg daily.  - Patient counseled on side effects such as nausea and headache, she was advised to stop taking medication if she experiences suicidal ideation.  - Follow up (virtually) in 6 weeks to evaluate effectiveness.

## 2023-12-25 NOTE — Progress Notes (Signed)
 "  Complete physical exam  Patient: Crystal Hester   DOB: [birth year]   23 y.o. Female  MRN: [id number]  Subjective:    Chief Complaint  Patient presents with   Annual Exam    PT wishes to talk about starting anxiety meds    Crystal Hester is a 23 y.o. female who presents today for a complete physical exam. She reports consuming a general diet. She reports eating plenty fruits and vegetables. Admits she could be better about water intake. She relates she exercises approximately 4 times a week. She generally feels well. She reports sleeping well. She does have additional problems to discuss today.   Patient wishes to discuss anxiety medication. She states approximately a year ago was having chest pains with a normal workup, however, after graduating, chest pains stopped. As she is getting ready to start grad school she is experiencing increased stress and anxiety and is noticing increase in chest pains. 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   Most recent fall risk assessment:    12/25/2023    8:30 AM  Fall Risk   Falls in the past year? 0  Number falls in past yr: 0  Injury with Fall? 0  Follow up Falls evaluation completed     Most recent depression screenings:    12/25/2023    8:30 AM 11/29/2022    9:32 AM  PHQ 2/9 Scores  PHQ - 2 Score 0 0  PHQ- 9 Score 1 0      Data saved with a previous flowsheet row definition    Vision:Not within last year  and Dental: No current dental problems and Receives regular dental care  Patient Care Team: Alphonsa Glendia LABOR, MD as PCP - General (Family Medicine)   Show/hide medication list[1]  Review of Systems  Constitutional:  Negative for activity change, appetite change, fatigue and fever.  Eyes:  Negative for visual disturbance.  Respiratory:  Negative for cough and shortness of breath.   Cardiovascular:  Positive for chest pain.  Neurological:  Negative for  light-headedness and headaches.  Psychiatric/Behavioral:  Negative for agitation and decreased concentration. The patient is nervous/anxious.        Objective:     BP 119/79   Pulse 71   Temp 98.4 F (36.9 C)   Ht 5' 4 (1.626 m)   Wt 167 lb 12.8 oz (76.1 kg)   SpO2 98%   BMI 28.80 kg/m   Physical Exam Constitutional:      General: She is not in acute distress.    Appearance: Normal appearance. She is normal weight. She is not ill-appearing.  HENT:     Head: Normocephalic and atraumatic.     Mouth/Throat:     Mouth: Mucous membranes are moist.     Pharynx: Oropharynx is clear.  Eyes:     Extraocular Movements: Extraocular movements intact.     Conjunctiva/sclera: Conjunctivae normal.  Cardiovascular:     Rate and Rhythm: Normal rate and regular rhythm.     Heart sounds: Normal heart sounds. No murmur heard. Pulmonary:     Effort: Pulmonary effort is normal.     Breath sounds: Normal breath sounds. No wheezing, rhonchi or rales.  Musculoskeletal:     Right lower leg: No edema.     Left lower leg: No edema.  Skin:    General: Skin is warm and dry.  Neurological:     General: No focal  deficit present.     Mental Status: She is alert and oriented to person, place, and time.  Psychiatric:        Mood and Affect: Mood normal.        Behavior: Behavior normal.     No results found for any visits on 12/25/23.     Assessment & Plan:    Routine Health Maintenance and Physical Exam  Health Maintenance  Topic Date Due   HPV Vaccine (1 - 3-dose series) Never done   HIV Screening  Never done   Meningitis B Vaccine (1 of 2 - Standard) Never done   Hepatitis C Screening  Never done   COVID-19 Vaccine (1 - 2025-26 season) Never done   Chlamydia screening  12/24/2024*   Pap Smear  06/20/2025   DTaP/Tdap/Td vaccine (8 - Td or Tdap) 11/08/2033   Pneumococcal Vaccine  Completed   Flu Shot  Completed   Hepatitis B Vaccine  Completed  *Topic was postponed. The date shown  is not the original due date.    Discussed health benefits of physical activity, and encouraged her to engage in regular exercise appropriate for her age and condition.  Problem List Items Addressed This Visit       Other   Generalized anxiety disorder   Patient presents today with increased anxiety symptoms. 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 She appears well at this time. Discussion of treatment options.  - Start Zoloft  50 mg daily.  - Patient counseled on side effects such as nausea and headache, she was advised to stop taking medication if she experiences suicidal ideation.  - Follow up (virtually) in 6 weeks to evaluate effectiveness.       Relevant Medications   sertraline  (ZOLOFT ) 50 MG tablet   Other Visit Diagnoses       Annual visit for general adult medical examination without abnormal findings    -  Primary     Immunization due       Relevant Orders   Flu vaccine trivalent PF, 6mos and older(Flulaval,Afluria,Fluarix,Fluzone) (Completed)      Return in about 6 weeks (around 02/05/2024) for anxiety.  Safety measures discussed. Immunizations reviewed: flu shot administered today.  Diet and exercise/ lifestyle modifications discussed: Recommend 150 minutes per week of exercise such as walking. Recommend lots of fresh produce to include fruits, vegetables, beans, healthy fats such as avocado, nuts, seeds, and 3-6 ounces of protein at each meal.  Avoid fried foods and fast food. Limit alcohol consumption: no more than one drink per day for women and 2 drinks per day for men.  Stress management discussed. Routine vision and dental screening discussed: recommend dentist every 6 months, gets vision checked every 1-2 years.  Health maintenance: Pap up to date, denies STI screening today.  Questions answered.       Teandre Hamre, PA-C     [1]  Outpatient Medications Prior to Visit  Medication Sig    Norethindrone-Ethinyl Estradiol-Fe Biphas (LO LOESTRIN FE ) 1 MG-10 MCG / 10 MCG tablet Take 1 tablet by mouth daily. Take 1 daily by mouth   aspirin EC 81 MG tablet Take 81 mg by mouth daily. Swallow whole. (Patient not taking: Reported on 12/25/2023)   scopolamine  (TRANSDERM SCOP , 1.5 MG,) 1 MG/3DAYS Place 1 patch (1.5 mg total) onto the skin every 3 (three) days. (Patient not taking: Reported on 12/25/2023)   No facility-administered medications prior to visit.   "

## 2023-12-28 LAB — QUANTIFERON-TB GOLD PLUS
QuantiFERON Mitogen Value: >10 [IU]/mL
QuantiFERON Nil Value: 0.04 [IU]/mL
QuantiFERON TB1 Ag Value: 0.04 [IU]/mL
QuantiFERON TB2 Ag Value: 0.04 [IU]/mL

## 2023-12-29 ENCOUNTER — Ambulatory Visit: Payer: Self-pay | Admitting: Family Medicine

## 2024-01-10 ENCOUNTER — Encounter: Payer: Self-pay | Admitting: Cardiovascular Disease

## 2024-01-30 ENCOUNTER — Encounter: Payer: Self-pay | Admitting: Family Medicine

## 2024-01-30 ENCOUNTER — Other Ambulatory Visit: Payer: Self-pay | Admitting: Physician Assistant

## 2024-01-30 ENCOUNTER — Other Ambulatory Visit: Payer: Self-pay | Admitting: Family Medicine

## 2024-01-30 ENCOUNTER — Encounter: Payer: Self-pay | Admitting: Physician Assistant

## 2024-01-30 DIAGNOSIS — Z23 Encounter for immunization: Secondary | ICD-10-CM

## 2024-01-31 ENCOUNTER — Ambulatory Visit: Payer: Self-pay | Admitting: Family Medicine

## 2024-01-31 LAB — HEPATITIS B SURFACE ANTIBODY, QUANTITATIVE: Hepatitis B Surf Ab Quant: <3.5 m[IU]/mL — ABNORMAL LOW

## 2024-02-08 ENCOUNTER — Other Ambulatory Visit (HOSPITAL_COMMUNITY): Payer: Self-pay

## 2024-02-08 ENCOUNTER — Other Ambulatory Visit (HOSPITAL_BASED_OUTPATIENT_CLINIC_OR_DEPARTMENT_OTHER): Payer: Self-pay

## 2024-02-08 ENCOUNTER — Other Ambulatory Visit: Payer: Self-pay

## 2024-02-13 ENCOUNTER — Ambulatory Visit: Payer: Self-pay | Admitting: Family Medicine
